# Patient Record
Sex: Male | Born: 1995 | Race: Black or African American | Hispanic: No | Marital: Single | Smoking: Current every day smoker
Health system: Southern US, Community
[De-identification: ages and names within clinical notes are randomized; demographics above are authoritative.]

## PROBLEM LIST (undated history)

## (undated) DIAGNOSIS — J45909 Unspecified asthma, uncomplicated: Secondary | ICD-10-CM

---
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
## 2013-11-02 ENCOUNTER — Encounter (HOSPITAL_BASED_OUTPATIENT_CLINIC_OR_DEPARTMENT_OTHER): Payer: Self-pay | Admitting: Emergency Medicine

## 2013-11-02 ENCOUNTER — Emergency Department (HOSPITAL_BASED_OUTPATIENT_CLINIC_OR_DEPARTMENT_OTHER)
Admission: EM | Admit: 2013-11-02 | Discharge: 2013-11-02 | Disposition: A | Discharge status: Home / Self Care | Payer: Medicaid Other | Attending: Emergency Medicine | Admitting: Emergency Medicine

## 2013-11-02 DIAGNOSIS — W503XXA Accidental bite by another person, initial encounter: Secondary | ICD-10-CM

## 2013-11-02 DIAGNOSIS — Z880 Allergy status to penicillin: Secondary | ICD-10-CM | POA: Insufficient documentation

## 2013-11-02 DIAGNOSIS — S21109A Unspecified open wound of unspecified front wall of thorax without penetration into thoracic cavity, initial encounter: Secondary | ICD-10-CM | POA: Insufficient documentation

## 2013-11-02 DIAGNOSIS — IMO0002 Reserved for concepts with insufficient information to code with codable children: Secondary | ICD-10-CM | POA: Insufficient documentation

## 2013-11-02 MED ORDER — CLINDAMYCIN HCL 300 MG PO CAPS: 300.0000 mg | ORAL_CAPSULE | Freq: Four times a day (QID) | ORAL | Status: DC
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
## 2014-06-08 ENCOUNTER — Emergency Department (HOSPITAL_BASED_OUTPATIENT_CLINIC_OR_DEPARTMENT_OTHER)
Admission: EM | Admit: 2014-06-08 | Discharge: 2014-06-08 | Disposition: A | Discharge status: Home / Self Care | Payer: Medicaid Other | Attending: Emergency Medicine | Admitting: Emergency Medicine

## 2014-06-08 ENCOUNTER — Encounter (HOSPITAL_BASED_OUTPATIENT_CLINIC_OR_DEPARTMENT_OTHER): Payer: Self-pay | Admitting: *Deleted

## 2014-06-08 DIAGNOSIS — Z792 Long term (current) use of antibiotics: Secondary | ICD-10-CM | POA: Insufficient documentation

## 2014-06-08 DIAGNOSIS — R42 Dizziness and giddiness: Secondary | ICD-10-CM

## 2014-06-08 DIAGNOSIS — Z720 Tobacco use: Secondary | ICD-10-CM | POA: Insufficient documentation

## 2014-06-08 DIAGNOSIS — Z880 Allergy status to penicillin: Secondary | ICD-10-CM | POA: Insufficient documentation

## 2014-06-08 NOTE — ED Notes (Signed)
Pt states he needs a note for work

## 2014-06-08 NOTE — ED Notes (Signed)
Pt reports 10 second episode of dizziness last night- second episode this morning around 0800- denies sx at present

## 2014-06-08 NOTE — ED Provider Notes (Signed)
CSN: [id number]     Arrival date & time 06/08/14  1122 History   First MD Initiated Contact with Patient 06/08/14 1230     Chief Complaint  Patient presents with  . Dizziness     (Consider location/radiation/quality/duration/timing/severity/associated sxs/prior Treatment) HPI Comments: Patient is a 19 yo M PMHx significant for tobacco abuse presenting to the ED for evaluation of lightheadedness. Patient states he had a 10 second episode of lightheadedness without syncope, CP, SOB, HA. Patient states his symptoms resolved laying down. He had another recurrence this morning around 800AM, none since then. No cardiac history. No early familial cardiac history.  Patient is a 19 y.o. male presenting with dizziness.  Dizziness   History reviewed. No pertinent past medical history. History reviewed. No pertinent past surgical history. No family history on file. History  Substance Use Topics  . Smoking status: Current Some Day Smoker    Types: Cigarettes  . Smokeless tobacco: Never Used  . Alcohol Use: No    Review of Systems  Neurological: Positive for light-headedness.  All other systems reviewed and are negative.     Allergies  Amoxicillin and Penicillins  Home Medications   Prior to Admission medications   Medication Sig Start Date End Date Taking? Authorizing Provider  clindamycin (CLEOCIN) 300 MG capsule Take 1 capsule (300 mg total) by mouth 4 (four) times daily. X 7 days 11/02/13   Geoffery Lyonsouglas Delo, MD   BP 137/70 mmHg  Pulse 51  Temp(Src) 97.8 F (36.6 C) (Oral)  Resp 20  Ht 5\' 7"  (1.702 m)  Wt 145 lb (65.772 kg)  BMI 22.71 kg/m2  SpO2 100% Physical Exam  Constitutional: He is oriented to person, place, and time. He appears well-developed and well-nourished. No distress.  HENT:  Head: Normocephalic and atraumatic.  Right Ear: External ear normal.  Left Ear: External ear normal.  Nose: Nose normal.  Mouth/Throat: Oropharynx is clear and moist. No oropharyngeal  exudate.  Eyes: Conjunctivae and EOM are normal. Pupils are equal, round, and reactive to light.  Neck: Normal range of motion. Neck supple.  Cardiovascular: Normal rate, regular rhythm, normal heart sounds and intact distal pulses.   Pulmonary/Chest: Effort normal and breath sounds normal. No respiratory distress.  Abdominal: Soft. There is no tenderness.  Neurological: He is alert and oriented to person, place, and time. He has normal strength. No cranial nerve deficit. Gait normal. GCS eye subscore is 4. GCS verbal subscore is 5. GCS motor subscore is 6.  Sensation grossly intact.  No pronator drift.  Bilateral heel-knee-shin intact.  Skin: Skin is warm and dry. He is not diaphoretic.  Nursing note and vitals reviewed.   ED Course  Procedures (including critical care time) Labs Review Labs Reviewed - No data to display  Imaging Review No results found.   EKG Interpretation None      MDM   Final diagnoses:  Intermittent lightheadedness    Filed Vitals:   06/08/14 1127  BP: 137/70  Pulse: 51  Temp: 97.8 F (36.6 C)  Resp: 20   Afebrile, NAD, non-toxic appearing, AAOx4.  No neurofocal deficits on examination. No history of personal cardiac medical problems or early familial cardiac problems. No concerning physical examination findings. No orthostasis. Symptom free in ED. Advised PCP f/u. Return precautions discussed. Patient is stable at time of discharge     Francee PiccoloJennifer Ladaija Dimino, PA-C 06/09/14 [phone]  Shon Batonourtney F Horton, MD 06/09/14 [phone]

## 2014-06-08 NOTE — Discharge Instructions (Signed)
Please follow up with your primary care physician in 1-2 days. If you do not have one please call the [facility] number listed above. Please read all discharge instructions and return precautions.  ° ° °Dizziness °Dizziness is a common problem. It is a feeling of unsteadiness or light-headedness. You may feel like you are about to faint. Dizziness can lead to injury if you stumble or fall. A person of any age group can suffer from dizziness, but dizziness is more common in older adults. °CAUSES  °Dizziness can be caused by many different things, including: °· Middle ear problems. °· Standing for too long. °· Infections. °· An allergic reaction. °· Aging. °· An emotional response to something, such as the sight of blood. °· Side effects of medicines. °· Tiredness. °· Problems with circulation or blood pressure. °· Excessive use of alcohol or medicines, or illegal drug use. °· Breathing too fast (hyperventilation). °· An irregular heart rhythm (arrhythmia). °· A low red blood cell count (anemia). °· Pregnancy. °· Vomiting, diarrhea, fever, or other illnesses that cause body fluid loss (dehydration). °· Diseases or conditions such as Parkinson's disease, high blood pressure (hypertension), diabetes, and thyroid problems. °· Exposure to extreme heat. °DIAGNOSIS  °Your health care provider will ask about your symptoms, perform a physical exam, and perform an electrocardiogram (ECG) to record the electrical activity of your heart. Your health care provider may also perform other heart or blood tests to determine the cause of your dizziness. These may include: °· Transthoracic echocardiogram (TTE). During echocardiography, sound waves are used to evaluate how blood flows through your heart. °· Transesophageal echocardiogram (TEE). °· Cardiac monitoring. This allows your health care provider to monitor your heart rate and rhythm in real time. °· Holter monitor. This is a portable device that records  your heartbeat and can help diagnose heart arrhythmias. It allows your health care provider to track your heart activity for several days if needed. °· Stress tests by exercise or by giving medicine that makes the heart beat faster. °TREATMENT  °Treatment of dizziness depends on the cause of your symptoms and can vary greatly. °HOME CARE INSTRUCTIONS  °· Drink enough fluids to keep your urine clear or pale yellow. This is especially important in very hot weather. In older adults, it is also important in cold weather. °· Take your medicine exactly as directed if your dizziness is caused by medicines. When taking blood pressure medicines, it is especially important to get up slowly. °¨ Rise slowly from chairs and steady yourself until you feel okay. °¨ In the morning, first sit up on the side of the bed. When you feel okay, stand slowly while holding onto something until you know your balance is fine. °· Move your legs often if you need to stand in one place for a long time. Tighten and relax your muscles in your legs while standing. °· Have someone stay with you for 1-2 days if dizziness continues to be a problem. Do this until you feel you are well enough to stay alone. Have the person call your health care provider if he or she notices changes in you that are concerning. °· Do not drive or use heavy machinery if you feel dizzy. °· Do not drink alcohol. °SEEK IMMEDIATE MEDICAL CARE IF:  °· Your dizziness or light-headedness gets worse. °· You feel nauseous or vomit. °· You have problems talking, walking, or using your arms, hands, or legs. °· You feel weak. °· You are   not thinking clearly or you have trouble forming sentences. It may take a friend or family member to notice this. °· You have chest pain, abdominal pain, shortness of breath, or sweating. °· Your vision changes. °· You notice any bleeding. °· You have side effects from medicine that seems to be getting worse rather than better. °MAKE SURE YOU:   °· Understand these instructions. °· Will watch your condition. °· Will get help right away if you are not doing well or get worse. °Document Released: 09/01/2000 Document Revised: 03/13/2013 Document Reviewed: 09/25/2010 °Exit[employer]® Patient Information ©2015 [employer]. This information is not intended to replace advice given to you by your health care provider. Make sure you discuss any questions you have with your health care provider. ° °

## 2017-01-10 ENCOUNTER — Emergency Department (HOSPITAL_COMMUNITY): Payer: No Typology Code available for payment source

## 2017-01-10 ENCOUNTER — Encounter (HOSPITAL_COMMUNITY): Payer: Self-pay

## 2017-01-10 ENCOUNTER — Emergency Department (HOSPITAL_COMMUNITY)
Admission: EM | Admit: 2017-01-10 | Discharge: 2017-01-10 | Disposition: A | Discharge status: Home / Self Care | Payer: No Typology Code available for payment source | Attending: Emergency Medicine | Admitting: Emergency Medicine

## 2017-01-10 DIAGNOSIS — R200 Anesthesia of skin: Secondary | ICD-10-CM | POA: Insufficient documentation

## 2017-01-10 DIAGNOSIS — M25551 Pain in right hip: Secondary | ICD-10-CM | POA: Insufficient documentation

## 2017-01-10 DIAGNOSIS — F17210 Nicotine dependence, cigarettes, uncomplicated: Secondary | ICD-10-CM | POA: Diagnosis not present

## 2017-01-10 DIAGNOSIS — Y92410 Unspecified street and highway as the place of occurrence of the external cause: Secondary | ICD-10-CM | POA: Diagnosis not present

## 2017-01-10 DIAGNOSIS — Y999 Unspecified external cause status: Secondary | ICD-10-CM | POA: Insufficient documentation

## 2017-01-10 HISTORY — DX: Unspecified asthma, uncomplicated: J45.909

## 2017-01-10 LAB — COMPREHENSIVE METABOLIC PANEL
ALBUMIN: 4.4 g/dL (ref 3.5–5.0)
ALK PHOS: 82 U/L (ref 38–126)
ALT: 17 U/L (ref 17–63)
ANION GAP: 10 (ref 5–15)
AST: 26 U/L (ref 15–41)
BILIRUBIN TOTAL: 2.3 mg/dL — AB (ref 0.3–1.2)
BUN: 15 mg/dL (ref 6–20)
CALCIUM: 9.6 mg/dL (ref 8.9–10.3)
CO2: 26 mmol/L (ref 22–32)
CREATININE: 1.12 mg/dL (ref 0.61–1.24)
Chloride: 103 mmol/L (ref 101–111)
GFR calc Af Amer: >60 mL/min (ref >60)
GFR calc non Af Amer: >60 mL/min (ref >60)
GLUCOSE: 75 mg/dL (ref 65–99)
Potassium: 3.7 mmol/L (ref 3.5–5.1)
SODIUM: 139 mmol/L (ref 135–145)
TOTAL PROTEIN: 7.2 g/dL (ref 6.5–8.1)

## 2017-01-10 LAB — SAMPLE TO BLOOD BANK

## 2017-01-10 LAB — CBC
HEMATOCRIT: 44.2 % (ref 39.0–52.0)
HEMOGLOBIN: 15.5 g/dL (ref 13.0–17.0)
MCH: 30.2 pg (ref 26.0–34.0)
MCHC: 35.1 g/dL (ref 30.0–36.0)
MCV: 86 fL (ref 78.0–100.0)
Platelets: 228 10*3/uL (ref 150–400)
RBC: 5.14 MIL/uL (ref 4.22–5.81)
RDW: 13 % (ref 11.5–15.5)
WBC: 5.5 10*3/uL (ref 4.0–10.5)

## 2017-01-10 LAB — I-STAT CG4 LACTIC ACID, ED: Lactic Acid, Venous: 2.9 mmol/L (ref 0.5–1.9)

## 2017-01-10 LAB — I-STAT CHEM 8, ED
BUN: 21 mg/dL — AB (ref 6–20)
CALCIUM ION: 1.13 mmol/L — AB (ref 1.15–1.40)
CHLORIDE: 101 mmol/L (ref 101–111)
Creatinine, Ser: 1.1 mg/dL (ref 0.61–1.24)
Glucose, Bld: 76 mg/dL (ref 65–99)
HCT: 47 % (ref 39.0–52.0)
Hemoglobin: 16 g/dL (ref 13.0–17.0)
POTASSIUM: 3.9 mmol/L (ref 3.5–5.1)
SODIUM: 141 mmol/L (ref 135–145)
TCO2: 28 mmol/L (ref 22–32)

## 2017-01-10 LAB — CDS SEROLOGY

## 2017-01-10 LAB — ETHANOL

## 2017-01-10 LAB — PROTIME-INR
INR: 1.04
PROTHROMBIN TIME: 13.5 s (ref 11.4–15.2)

## 2017-01-10 MED ORDER — ONDANSETRON HCL 4 MG/2ML IJ SOLN
4.0000 mg | Freq: Once | INTRAMUSCULAR | Status: AC
  Administered 2017-01-10: 4 mg via INTRAVENOUS
  Filled 2017-01-10: qty 2

## 2017-01-10 MED ORDER — HYDROMORPHONE HCL 1 MG/ML IJ SOLN
0.5000 mg | Freq: Once | INTRAMUSCULAR | Status: AC
  Administered 2017-01-10: 0.5 mg via INTRAVENOUS
  Filled 2017-01-10: qty 1

## 2017-01-10 MED ORDER — CYCLOBENZAPRINE HCL 10 MG PO TABS: 10.0000 mg | ORAL_TABLET | Freq: Three times a day (TID) | ORAL | 0 refills | Status: AC

## 2017-01-10 MED ORDER — SODIUM CHLORIDE 0.9 % IV SOLN
INTRAVENOUS | Status: DC
  Administered 2017-01-10: 125 mL/h via INTRAVENOUS

## 2017-01-10 MED ORDER — IOPAMIDOL (ISOVUE-300) INJECTION 61%
INTRAVENOUS | Status: AC
  Administered 2017-01-10: 100 mL
  Filled 2017-01-10: qty 100

## 2017-01-10 MED ORDER — SODIUM CHLORIDE 0.9 % IV BOLUS (SEPSIS)
1000.0000 mL | Freq: Once | INTRAVENOUS | Status: AC
  Administered 2017-01-10: 1000 mL via INTRAVENOUS

## 2017-01-10 NOTE — ED Notes (Signed)
Dr. Miquel DunnGarza bedside to evaluate pt at this time, backboard removed by MD.
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
## 2017-01-10 NOTE — ED Notes (Signed)
ED Provider at bedside. 
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
## 2017-01-10 NOTE — ED Notes (Signed)
Upon entry to pt room, pt found sitting straight up in bed from the help of his family. Pt laid flat by this RN and asked to stay in the flat position until cleared after imaging. Xray bedside at this time.

## 2017-01-10 NOTE — ED Notes (Signed)
Dr. Verdie MosherLiu made aware of right lower extremity numbness and lack of movement. She is made aware that no spinal imaging is currently ordered. She gives VO for C spine CT
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
## 2017-01-10 NOTE — ED Notes (Addendum)
ED Provider at bedside. Dr. Joni FearsLui at bedside
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
## 2018-10-20 ENCOUNTER — Emergency Department (HOSPITAL_BASED_OUTPATIENT_CLINIC_OR_DEPARTMENT_OTHER)
Admission: EM | Admit: 2018-10-20 | Discharge: 2018-10-20 | Disposition: A | Discharge status: Home / Self Care | Payer: Self-pay | Attending: Emergency Medicine | Admitting: Emergency Medicine

## 2018-10-20 ENCOUNTER — Encounter (HOSPITAL_BASED_OUTPATIENT_CLINIC_OR_DEPARTMENT_OTHER): Payer: Self-pay

## 2018-10-20 ENCOUNTER — Other Ambulatory Visit: Payer: Self-pay

## 2018-10-20 DIAGNOSIS — J020 Streptococcal pharyngitis: Secondary | ICD-10-CM | POA: Insufficient documentation

## 2018-10-20 DIAGNOSIS — F17210 Nicotine dependence, cigarettes, uncomplicated: Secondary | ICD-10-CM | POA: Insufficient documentation

## 2018-10-20 DIAGNOSIS — J45909 Unspecified asthma, uncomplicated: Secondary | ICD-10-CM | POA: Insufficient documentation

## 2018-10-20 LAB — GROUP A STREP BY PCR: Group A Strep by PCR: DETECTED — AB

## 2018-10-20 MED ORDER — IBUPROFEN 800 MG PO TABS
800.0000 mg | ORAL_TABLET | Freq: Once | ORAL | Status: AC
  Administered 2018-10-20: 800 mg via ORAL
  Filled 2018-10-20: qty 1

## 2018-10-20 MED ORDER — AZITHROMYCIN 500 MG PO TABS: 500.0000 mg | ORAL_TABLET | Freq: Every day | ORAL | 0 refills | Status: AC

## 2018-10-20 NOTE — Discharge Instructions (Addendum)
You were seen in the emergency department and diagnosed with strep throat.  -A prescription has been sent to your pharmacy for azithromycin.  This is an antibiotic.  Please take as prescribed.  You should gradually feel better over the next few days. Take Tylenol and Ibuprofen for fever and pain. Follow up with your primary care provider in the next 1 week if you are not feeling better, if you do not have a primary care provider one is provided in your discharge instructions. Return to the emergency department for any new or worsening symptoms including but not limited to inability to open your mouth, inability to move your neck, worsening pain, change in your voice, inability to swallow your own saliva, drooling, or any other concerns.

## 2018-10-20 NOTE — ED Notes (Addendum)
Patient refused for Covid test.

## 2018-10-20 NOTE — ED Triage Notes (Addendum)
Pt c/o sore throat, cough, fever, loss of taste-n/v x 2 days-NAD-steady gait

## 2018-10-20 NOTE — ED Provider Notes (Signed)
[facility] EMERGENCY DEPARTMENT Provider Note   CSN: [id number] Arrival date & time: 10/20/18  1241    History   Chief Complaint Chief Complaint  Patient presents with  . Sore Throat    HPI Evan Little is a 23 y.o. male with past medical history of asthma presents emergency department today with chief complaint of sore throat.  He states this is been going on x3 days.  Patient reports pain is worse when swallowing.  He states his throat feels raw.  He rates the pain 8 out of 10 in severity.  He has been taking Tylenol with minimal relief.  He thinks he has been febrile at home but has not checked his temperature.  He reports associated loss of taste.  He denies cough, congestion, chest pain, shortness of breath, nausea, vomiting, neck pain. He denies any sick contacts.  Does not think he has been around anyone positive for COVID-19.   Past Medical History:  Diagnosis Date  . Asthma     There are no active problems to display for this patient.   History reviewed. No pertinent surgical history.      Home Medications    Prior to Admission medications   Medication Sig Start Date End Date Taking? Authorizing Provider  azithromycin (ZITHROMAX) 500 MG tablet Take 1 tablet (500 mg total) by mouth daily for 5 days. Take one pill daily for 5 days 10/20/18 10/25/18  Xylah Early, Harley Hallmark, PA-C    Family History No family history on file.  Social History Social History   Tobacco Use  . Smoking status: Current Some Day Smoker    Types: Cigarettes  . Smokeless tobacco: Never Used  Substance Use Topics  . Alcohol use: Yes    Comment: weekly  . Drug use: Yes    Types: Marijuana     Allergies   Amoxicillin and Penicillins   Review of Systems Review of Systems  Constitutional: Positive for fever. Negative for chills and diaphoresis.  HENT: Positive for sore throat. Negative for congestion, ear pain, facial swelling, sinus pressure, sinus pain, trouble  swallowing and voice change.   Respiratory: Negative for cough.   Cardiovascular: Negative for chest pain.  Gastrointestinal: Negative for abdominal pain, nausea and vomiting.  Musculoskeletal: Negative for neck pain and neck stiffness.  Skin: Negative for rash and wound.     Physical Exam Updated Vital Signs BP 124/81 (BP Location: Left Arm)   Pulse 73   Temp 98.8 F (37.1 C) (Oral)   Resp 18   Ht 5\' 6"  (1.676 m)   Wt 59.9 kg   SpO2 98%   BMI 21.31 kg/m   Physical Exam Vitals signs and nursing note reviewed.  Constitutional:      Appearance: He is well-developed. He is not ill-appearing or toxic-appearing.  HENT:     Head: Normocephalic and atraumatic.     Comments: No sinus or temporal tenderness.    Nose: Nose normal.     Mouth/Throat:     Comments: Minor erythema to oropharynx, no edema, no exudate, no tonsillar swelling, voice normal, neck supple without lymphadenopathy. Eyes:     General: No scleral icterus.       Right eye: No discharge.        Left eye: No discharge.     Conjunctiva/sclera: Conjunctivae normal.  Neck:     Musculoskeletal: Normal range of motion. No muscular tenderness.     Vascular: No JVD.  Cardiovascular:     Rate and  Rhythm: Normal rate and regular rhythm.     Pulses: Normal pulses.     Heart sounds: Normal heart sounds.  Pulmonary:     Effort: Pulmonary effort is normal.     Breath sounds: Normal breath sounds.  Abdominal:     General: There is no distension.  Musculoskeletal: Normal range of motion.  Lymphadenopathy:     Cervical: No cervical adenopathy.  Skin:    General: Skin is warm and dry.  Neurological:     Mental Status: He is oriented to person, place, and time.     GCS: GCS eye subscore is 4. GCS verbal subscore is 5. GCS motor subscore is 6.     Comments: Fluent speech, no facial droop.  Psychiatric:        Behavior: Behavior normal.      ED Treatments / Results  Labs (all labs ordered are listed, but only  abnormal results are displayed) Labs Reviewed  GROUP A STREP BY PCR - Abnormal; Notable for the following components:      Result Value   Group A Strep by PCR DETECTED (*)    All other components within normal limits    EKG None  Radiology No results found.  Procedures Procedures (including critical care time)  Medications Ordered in ED Medications  ibuprofen (ADVIL) tablet 800 mg (800 mg Oral Given 10/20/18 1359)     Initial Impression / Assessment and Plan / ED Course  I have reviewed the triage vital signs and the nursing notes.  Pertinent labs & imaging results that were available during my care of the patient were reviewed by me and considered in my medical decision making (see chart for details).  23 yo male who presents with sore throat. Still able to tolerate PO/secretions but with worsening pain. Patient is afebrile, non-toxic appearing, sitting comfortably on examination table. Vital signs reviewed and stable. On exam there is minor erythema to posterior oropharynx, no exudate.. Presentation not concerning for PTA or Ludwig's angina, Uvulitis, epiglottitis, peritonsillar abscess, or retropharyngeal abscess.   Patient is positive for strep.  Has allergies to amoxicillin and penicillins.  Will discharge home with prescription for azithromycin. Pt does not appear dehydrated, but did discuss importance of water rehydration.  Encouraged at home supportive care measures. Patient successfully fluid challenged in the ED without difficulty swallowing.  Strict return precautions given. NAD. VSS. Recommended PCP follow up for re-evaluation.   Evan Little was evaluated in Emergency Department on 10/20/2018 for the symptoms described in the history of present illness. He was evaluated in the context of the global COVID-19 pandemic, which necessitated consideration that the patient might be at risk for infection with the SARS-CoV-2 virus that causes COVID-19. Institutional protocols and  algorithms that pertain to the evaluation of patients at risk for COVID-19 are in a state of rapid change based on information released by regulatory bodies including the CDC and federal and state organizations. These policies and algorithms were followed during the patient's care in the ED.  This note was prepared using Dragon voice recognition software and may include unintentional dictation errors due to the inherent limitations of voice recognition software.   Final Clinical Impressions(s) / ED Diagnoses   Final diagnoses:  Strep throat    ED Discharge Orders         Ordered    azithromycin (ZITHROMAX) 500 MG tablet  Daily     10/20/18 1508           Ashlen Kiger, HolidayKaitlyn E,  PA-C 10/20/18 1511    Arby BarrettePfeiffer, Marcy, MD 10/21/18 [phone]

## 2019-10-25 ENCOUNTER — Encounter (HOSPITAL_BASED_OUTPATIENT_CLINIC_OR_DEPARTMENT_OTHER): Payer: Self-pay

## 2019-10-25 ENCOUNTER — Other Ambulatory Visit: Payer: Self-pay

## 2019-10-25 ENCOUNTER — Emergency Department (HOSPITAL_BASED_OUTPATIENT_CLINIC_OR_DEPARTMENT_OTHER)
Admission: EM | Admit: 2019-10-25 | Discharge: 2019-10-25 | Discharge status: Against Medical Advice | Payer: Self-pay | Attending: Emergency Medicine | Admitting: Emergency Medicine

## 2019-10-25 DIAGNOSIS — J45909 Unspecified asthma, uncomplicated: Secondary | ICD-10-CM | POA: Insufficient documentation

## 2019-10-25 DIAGNOSIS — F17210 Nicotine dependence, cigarettes, uncomplicated: Secondary | ICD-10-CM | POA: Insufficient documentation

## 2019-10-25 DIAGNOSIS — J029 Acute pharyngitis, unspecified: Secondary | ICD-10-CM | POA: Insufficient documentation

## 2019-10-25 LAB — GROUP A STREP BY PCR: Group A Strep by PCR: NOT DETECTED

## 2019-10-25 MED ORDER — DEXAMETHASONE 6 MG PO TABS
6.0000 mg | ORAL_TABLET | Freq: Once | ORAL | Status: AC
  Administered 2019-10-25: 6 mg via ORAL
  Filled 2019-10-25: qty 1

## 2019-10-25 MED ORDER — LIDOCAINE VISCOUS HCL 2 % MT SOLN
15.0000 mL | Freq: Once | OROMUCOSAL | Status: DC
  Filled 2019-10-25: qty 15

## 2019-10-25 MED ORDER — ACETAMINOPHEN 500 MG PO TABS
1000.0000 mg | ORAL_TABLET | Freq: Once | ORAL | Status: AC
  Administered 2019-10-25: 1000 mg via ORAL
  Filled 2019-10-25: qty 2

## 2019-10-25 NOTE — ED Triage Notes (Signed)
Pt arrives with c/o sore throat since Sunday with some fatigue, denies any other symptoms or being around anyone with similar symptoms.

## 2019-10-25 NOTE — ED Notes (Signed)
Pt states he does not want to be tested for Covid

## 2019-10-25 NOTE — ED Provider Notes (Signed)
[facility] EMERGENCY DEPARTMENT Provider Note   CSN: [id number] Arrival date & time: 10/25/19  1002    History Chief Complaint  Patient presents with  . Sore Throat    Evan Little is a 24 y.o. male with past medical history significant for recurrent strep pharyngitis, asthma who presents for evaluation of sore throat.  Sore throat since Sunday, 4 days PTA.  Has been taking intermittently some ibuprofen without relief.  No unilateral throat swelling.  Has also had a nonproductive cough. States he did get the Covid vaccine however he is not sure which one.  States it was a "two dose."  No sick contacts.  No headache, lightheadedness, dizziness, neck pain, neck stiffness, fever, chills, abdominal pain, diarrhea, dysuria last dose of ibuprofen over 24 hours ago.  States symptoms seem similar to his last episode of strep.  No recent antibiotics.  He has not been seen for this issue previously.  Rates his pain a 7/10.  States he has had limited p.o. intake lower was able to eat "[employer] chili." Denies additional aggravating or alleviating factors.  History obtained from patient and past medical records. No interpretor was used.  HPI     Past Medical History:  Diagnosis Date  . Asthma     There are no problems to display for this patient.   History reviewed. No pertinent surgical history.     No family history on file.  Social History   Tobacco Use  . Smoking status: Current Some Day Smoker    Types: Cigarettes  . Smokeless tobacco: Never Used  Vaping Use  . Vaping Use: Never used  Substance Use Topics  . Alcohol use: Yes    Comment: weekly  . Drug use: Yes    Types: Marijuana    Home Medications Prior to Admission medications   Not on File    Allergies    Amoxicillin and Penicillins  Review of Systems   Review of Systems  Constitutional: Negative.   HENT: Positive for sore throat and trouble swallowing. Negative for congestion, dental problem,  drooling, ear discharge, ear pain, facial swelling, mouth sores, nosebleeds, postnasal drip, rhinorrhea, sinus pressure, sinus pain, sneezing and voice change.   Respiratory: Negative.   Cardiovascular: Negative.   Gastrointestinal: Negative.   Genitourinary: Negative.   Musculoskeletal: Negative.   Skin: Negative.   Neurological: Negative.   All other systems reviewed and are negative.   Physical Exam Updated Vital Signs BP 136/83 (BP Location: Left Arm)   Pulse 72   Temp 99.6 F (37.6 C) (Oral)   Resp 18   Ht 5\' 7"  (1.702 m)   Wt 63.5 kg   SpO2 100%   BMI 21.93 kg/m   Physical Exam Vitals and nursing note reviewed.  Constitutional:      General: He is not in acute distress.    Appearance: He is well-developed. He is not ill-appearing, toxic-appearing or diaphoretic.  HENT:     Head: Normocephalic and atraumatic.     Jaw: There is normal jaw occlusion.     Right Ear: Tympanic membrane and ear canal normal.     Left Ear: Tympanic membrane and ear canal normal.     Nose: Nose normal. No congestion or rhinorrhea.     Right Sinus: No maxillary sinus tenderness or frontal sinus tenderness.     Left Sinus: No maxillary sinus tenderness or frontal sinus tenderness.     Mouth/Throat:     Lips: Pink.  Mouth: Mucous membranes are moist. No injury, lacerations, oral lesions or angioedema.     Tongue: No lesions. Tongue does not deviate from midline.     Palate: No mass and lesions.     Pharynx: Uvula midline. Posterior oropharyngeal erythema present. No pharyngeal swelling, oropharyngeal exudate or uvula swelling.     Tonsils: No tonsillar exudate or tonsillar abscesses. 2+ on the right. 2+ on the left.     Comments: No drooling, dysphagia or trismus.  Tongue midline.  Posterior oropharynx erythematous.  Eyes:     Extraocular Movements: Extraocular movements intact.     Conjunctiva/sclera: Conjunctivae normal.     Pupils: Pupils are equal, round, and reactive to light.    Neck:     Trachea: Trachea and phonation normal.     Comments: No neck stiffness or neck rigidity.  No meningismus. Cardiovascular:     Rate and Rhythm: Normal rate and regular rhythm.     Pulses: Normal pulses.          Radial pulses are 2+ on the right side and 2+ on the left side.       Dorsalis pedis pulses are 2+ on the right side and 2+ on the left side.       Posterior tibial pulses are 2+ on the right side and 2+ on the left side.     Heart sounds: Normal heart sounds.  Pulmonary:     Effort: Pulmonary effort is normal. No respiratory distress.     Breath sounds: Normal breath sounds and air entry.  Abdominal:     General: Bowel sounds are normal. There is no distension.     Palpations: Abdomen is soft.     Tenderness: There is no abdominal tenderness. There is no right CVA tenderness, left CVA tenderness, guarding or rebound. Negative signs include Murphy's sign and McBurney's sign.     Hernia: No hernia is present.     Comments: Soft, nontender. No rebound or guarding  Musculoskeletal:        General: Normal range of motion.     Cervical back: Full passive range of motion without pain, normal range of motion and neck supple.     Comments: Moves all 4 extremities without difficulty.  Compartments soft.  No bony tenderness.  Skin:    General: Skin is warm and dry.     Capillary Refill: Capillary refill takes less than 2 seconds.     Comments: Brisk capillary refill.  No edema, erythema or warmth.  No fluctuance or induration.  Neurological:     General: No focal deficit present.     Mental Status: He is alert.     Cranial Nerves: Cranial nerves are intact.     Sensory: Sensation is intact.     Motor: Motor function is intact.     Coordination: Coordination is intact.     Gait: Gait is intact.     Deep Tendon Reflexes: Reflexes are normal and symmetric.     Comments: Ambulatory without difficulty    ED Results / Procedures / Treatments   Labs (all labs ordered are  listed, but only abnormal results are displayed) Labs Reviewed  GROUP A STREP BY PCR    EKG None  Radiology No results found.  Procedures Procedures (including critical care time)  Medications Ordered in ED Medications  lidocaine (XYLOCAINE) 2 % viscous mouth solution 15 mL (15 mLs Mouth/Throat Refused 10/25/19 1213)  acetaminophen (TYLENOL) tablet 1,000 mg (1,000 mg Oral Given 10/25/19  1208)  dexamethasone (DECADRON) tablet 6 mg (6 mg Oral Given 10/25/19 1208)   ED Course  I have reviewed the triage vital signs and the nursing notes.  Pertinent labs & imaging results that were available during my care of the patient were reviewed by me and considered in my medical decision making (see chart for details).  24 year old presents for evaluation of sore throat.  He is afebrile, nonseptic, not ill-appearing.  Moderate posterior oropharyngeal erythema without exudate.  Tonsils 1+ bilaterally.  No drooling, dysphagia or trismus.  No evidence of PTA or RPA.  Sublingual area soft.  No submandibular swelling.  Low suspicion for deep space abscess or Ludwig's angina.  No neck stiffness or neck rigidity.  No meningismus.  Low suspicion for meningitis.  He has no congestion, rhinorrhea.  Does have nonproductive cough.  He is vaccinated with Covid denies any recent contacts.  Plan on symptomatic treatment, strep, Covid test and reassess.  I went to reassess patient and he was not in room.  Nursing did not see patient leave.   Patient declined COVID testing.  Went to reassess patient 20 minutes after first attempt at reassessment.  He is still not present in his room.  Was not able to reassess or discuss his strep test.  Strep negative.   Patient eloped from the emergency department.     MDM Rules/Calculators/A&P                           Final Clinical Impression(s) / ED Diagnoses Final diagnoses:  Sore throat    Rx / DC Orders ED Discharge Orders    None       Shermar Friedland A,  PA-C 10/25/19 1345    Pricilla Loveless, MD 10/25/19 1346

## 2019-10-25 NOTE — ED Notes (Signed)
Pt not found in room or anywhere in department, did not await discharge paperwork.

## 2019-10-25 NOTE — ED Notes (Signed)
Patient states he does have strep throat but is not getting better.

## 2021-11-12 ENCOUNTER — Other Ambulatory Visit: Payer: Self-pay

## 2021-11-12 ENCOUNTER — Encounter (HOSPITAL_BASED_OUTPATIENT_CLINIC_OR_DEPARTMENT_OTHER): Payer: Self-pay | Admitting: Emergency Medicine

## 2021-11-12 ENCOUNTER — Emergency Department (HOSPITAL_BASED_OUTPATIENT_CLINIC_OR_DEPARTMENT_OTHER)
Admission: EM | Admit: 2021-11-12 | Discharge: 2021-11-12 | Disposition: A | Discharge status: Home / Self Care | Payer: Self-pay | Attending: Emergency Medicine | Admitting: Emergency Medicine

## 2021-11-12 DIAGNOSIS — Z28310 Unvaccinated for covid-19: Secondary | ICD-10-CM | POA: Insufficient documentation

## 2021-11-12 DIAGNOSIS — U071 COVID-19: Secondary | ICD-10-CM | POA: Insufficient documentation

## 2021-11-12 LAB — SARS CORONAVIRUS 2 BY RT PCR: SARS Coronavirus 2 by RT PCR: POSITIVE — AB

## 2021-11-12 LAB — GROUP A STREP BY PCR: Group A Strep by PCR: NOT DETECTED

## 2021-11-12 MED ORDER — IBUPROFEN 800 MG PO TABS
800.0000 mg | ORAL_TABLET | Freq: Once | ORAL | Status: AC
  Administered 2021-11-12: 800 mg via ORAL
  Filled 2021-11-12: qty 1

## 2021-11-12 NOTE — ED Triage Notes (Signed)
Sore throat, dry cough and fever since yesterday. Took dayquil around 3 pm.

## 2021-11-12 NOTE — Discharge Instructions (Addendum)
You are positive for COVID.  Continue to drink plenty of fluids.  You can use warm water gargles, use lozenges, take Tylenol ibuprofen for pain control as well as fever control.  Your exam otherwise was reassuring.  Please return for any worsening symptoms.  Otherwise symptoms take amount of 5 to 7 days to improve.

## 2021-11-12 NOTE — ED Provider Notes (Signed)
[facility] EMERGENCY DEPARTMENT Provider Note   CSN: [id number] Arrival date & time: 11/12/21  1849     History  Chief Complaint  Patient presents with   Sore Throat    Evan Little is a 26 y.o. male.  25 year old male presents today for evaluation of cough, sore throat, fever, myalgias since yesterday.  Denies any known sick contacts.  Denies chest pain, shortness of breath.  Has not taken anything prior to arrival.  Febrile on arrival at 101.4.  Patient has not been vaccinated against COVID.  Denies any other health problems.  Overall healthy.  The history is provided by the patient. No language interpreter was used.       Home Medications Prior to Admission medications   Not on File      Allergies    Amoxicillin and Penicillins    Review of Systems   Review of Systems  Constitutional:  Positive for chills and fever.  HENT:  Positive for sore throat. Negative for congestion, drooling and trouble swallowing.   Respiratory:  Positive for cough. Negative for shortness of breath.   Cardiovascular:  Negative for chest pain.  Gastrointestinal:  Negative for nausea and vomiting.  Neurological:  Negative for light-headedness.  All other systems reviewed and are negative.   Physical Exam Updated Vital Signs BP 122/81 (BP Location: Left Arm)   Pulse 96   Temp (!) 101.4 F (38.6 C) (Oral)   Resp 18   Ht 6' (1.829 m)   Wt 71.2 kg   SpO2 98%   BMI 21.29 kg/m  Physical Exam Vitals and nursing note reviewed.  Constitutional:      General: He is not in acute distress.    Appearance: Normal appearance. He is not ill-appearing.  HENT:     Head: Normocephalic and atraumatic.     Nose: Nose normal.     Mouth/Throat:     Pharynx: Oropharynx is clear. No pharyngeal swelling, oropharyngeal exudate or posterior oropharyngeal erythema.     Tonsils: No tonsillar exudate or tonsillar abscesses.  Eyes:     Conjunctiva/sclera: Conjunctivae normal.  Cardiovascular:      Rate and Rhythm: Normal rate and regular rhythm.     Pulses: Normal pulses.  Pulmonary:     Effort: Pulmonary effort is normal. No respiratory distress.     Breath sounds: No wheezing or rales.  Musculoskeletal:        General: No deformity.  Skin:    Findings: No rash.  Neurological:     Mental Status: He is alert.     ED Results / Procedures / Treatments   Labs (all labs ordered are listed, but only abnormal results are displayed) Labs Reviewed  SARS CORONAVIRUS 2 BY RT PCR - Abnormal; Notable for the following components:      Result Value   SARS Coronavirus 2 by RT PCR POSITIVE (*)    All other components within normal limits  GROUP A STREP BY PCR    EKG None  Radiology No results found.  Procedures Procedures    Medications Ordered in ED Medications  ibuprofen (ADVIL) tablet 800 mg (800 mg Oral Given 11/12/21 1905)    ED Course/ Medical Decision Making/ A&P                           Medical Decision Making Risk Prescription drug management.   26 year old male presents today for evaluation of URI symptoms going on for 1  day.  Febrile on arrival.  Giving 100 Motrin.  Temperature during my evaluation was improved to 100.7.  Patient tested positive for COVID.  Negative for strep.  Symptomatic management discussed.  He is without chest pain, shortness of breath.  Patient is stable for discharge.  Discharged in stable condition.  Return precautions discussed.  Patient voices understanding and is in agreement with plan.   Final Clinical Impression(s) / ED Diagnoses Final diagnoses:  COVID-19    Rx / DC Orders ED Discharge Orders     None         Marita Kansas, PA-C 11/12/21 1955    Charlynne Pander, MD 11/19/21 [phone]

## 2021-12-13 ENCOUNTER — Emergency Department (HOSPITAL_BASED_OUTPATIENT_CLINIC_OR_DEPARTMENT_OTHER)
Admission: EM | Admit: 2021-12-13 | Discharge: 2021-12-13 | Disposition: A | Discharge status: Home / Self Care | Payer: Self-pay | Attending: Emergency Medicine | Admitting: Emergency Medicine

## 2021-12-13 ENCOUNTER — Encounter (HOSPITAL_BASED_OUTPATIENT_CLINIC_OR_DEPARTMENT_OTHER): Payer: Self-pay | Admitting: Emergency Medicine

## 2021-12-13 ENCOUNTER — Other Ambulatory Visit: Payer: Self-pay

## 2021-12-13 DIAGNOSIS — R21 Rash and other nonspecific skin eruption: Secondary | ICD-10-CM

## 2021-12-13 DIAGNOSIS — Z202 Contact with and (suspected) exposure to infections with a predominantly sexual mode of transmission: Secondary | ICD-10-CM | POA: Insufficient documentation

## 2021-12-13 LAB — URINALYSIS, ROUTINE W REFLEX MICROSCOPIC
Bilirubin Urine: NEGATIVE
Glucose, UA: NEGATIVE mg/dL
Hgb urine dipstick: NEGATIVE
Ketones, ur: NEGATIVE mg/dL
Leukocytes,Ua: NEGATIVE
Nitrite: NEGATIVE
Protein, ur: NEGATIVE mg/dL
Specific Gravity, Urine: 1.02 (ref 1.005–1.030)
pH: 7.5 (ref 5.0–8.0)

## 2021-12-13 MED ORDER — AZITHROMYCIN 250 MG PO TABS
2000.0000 mg | ORAL_TABLET | Freq: Once | ORAL | Status: AC
  Administered 2021-12-13: 2000 mg via ORAL
  Filled 2021-12-13: qty 8

## 2021-12-13 MED ORDER — DOXYCYCLINE HYCLATE 100 MG PO TABS: 100.0000 mg | ORAL_TABLET | Freq: Two times a day (BID) | ORAL | 0 refills | Status: AC

## 2021-12-13 MED ORDER — GENTAMICIN SULFATE 40 MG/ML IJ SOLN
240.0000 mg | Freq: Once | INTRAMUSCULAR | Status: DC
  Filled 2021-12-13: qty 6

## 2021-12-13 NOTE — ED Notes (Signed)
RN informed pt the IM gentamicin injection could either go in his quad or glutea muscle due to large amount of liquid (6 mL total). Pt refused injection due to inability to inject in deltoid. PA made aware.

## 2021-12-13 NOTE — ED Provider Notes (Signed)
[facility] EMERGENCY DEPARTMENT Provider Note   CSN: [id number] Arrival date & time: 12/13/21  1446     History  Chief Complaint  Patient presents with   Exposure to STD    Evan Little is a 26 y.o. male with no significant past medical history presents to the ED due to rash to penis x2 days.  He notes the rash is dryness on shaft of penis. Patient states his partner recently tested positive for chlamydia.  Denies abdominal pain.  No penile discharge.  Denies testicular pain and edema.  No fever or chills.  Denies history of herpes.  Denies shaving in groin region.  No nausea, vomiting, or diarrhea.  History obtained from patient and past medical records. No interpreter used during encounter.       Home Medications Prior to Admission medications   Medication Sig Start Date End Date Taking? Authorizing Provider  doxycycline (VIBRA-TABS) 100 MG tablet Take 1 tablet (100 mg total) by mouth 2 (two) times daily. 12/13/21  Yes Mamta Rimmer, Druscilla Brownie, PA-C      Allergies    Amoxicillin and Penicillins    Review of Systems   Review of Systems  Constitutional:  Negative for chills and fever.  Gastrointestinal:  Negative for abdominal pain.  Genitourinary:  Negative for dysuria, penile discharge, penile pain, penile swelling, scrotal swelling and testicular pain.  Skin:  Positive for rash.    Physical Exam Updated Vital Signs BP (!) 152/92 (BP Location: Left Arm)   Pulse 86   Temp 98.6 F (37 C) (Oral)   Resp 17   Ht 5\' 6"  (1.676 m)   Wt 65.8 kg   SpO2 99%   BMI 23.40 kg/m  Physical Exam Vitals and nursing note reviewed.  Constitutional:      General: He is not in acute distress.    Appearance: He is not ill-appearing.  HENT:     Head: Normocephalic.  Eyes:     Pupils: Pupils are equal, round, and reactive to light.  Cardiovascular:     Rate and Rhythm: Normal rate and regular rhythm.     Pulses: Normal pulses.     Heart sounds: Normal heart sounds. No  murmur heard.    No friction rub. No gallop.  Pulmonary:     Effort: Pulmonary effort is normal.     Breath sounds: Normal breath sounds.  Abdominal:     General: Abdomen is flat. There is no distension.     Palpations: Abdomen is soft.     Tenderness: There is no abdominal tenderness. There is no guarding or rebound.  Genitourinary:    Comments: GU exam performed with chaperone in room.  Small abrasions to shaft of penis.  Swab for herpes.  No blisters.  No penile discharge.  No testicular tenderness or edema. Musculoskeletal:        General: Normal range of motion.     Cervical back: Neck supple.  Skin:    General: Skin is warm and dry.  Neurological:     General: No focal deficit present.     Mental Status: He is alert.  Psychiatric:        Mood and Affect: Mood normal.        Behavior: Behavior normal.     ED Results / Procedures / Treatments   Labs (all labs ordered are listed, but only abnormal results are displayed) Labs Reviewed  HSV CULTURE AND TYPING  URINALYSIS, ROUTINE W REFLEX MICROSCOPIC  GC/CHLAMYDIA PROBE AMP (  [place]) NOT AT [facility]    EKG None  Radiology No results found.  Procedures Procedures    Medications Ordered in ED Medications  gentamicin (GARAMYCIN) injection 240 mg (has no administration in time range)  azithromycin (ZITHROMAX) tablet 2,000 mg (has no administration in time range)    ED Course/ Medical Decision Making/ A&P                           Medical Decision Making Amount and/or Complexity of Data Reviewed Labs: ordered. Decision-making details documented in ED Course.   26 year old male presents the ED due to penile rash x2 days.  Patient states his partner recently tested positive for chlamydia.  No dysuria.  No fever or chills.  No joint pain.  Upon arrival, stable vitals.  Patient in no acute distress.  GU exam performed with chaperone in room which demonstrates small abrasions to shaft of penis.  Swab for herpes  however, no distinct lesions.  No penile discharge.  No testicular tenderness or edema.  UA unremarkable.  No evidence of infection.  Gonorrhea/chlamydia pending.  Will treat prophylactically for gonorrhea and chlamydia given positive exposure.  Patient allergic to PCN, he was given alternative antibiotics. Herpes culture pending. Will hold on treatment given lesions do not appear distinct for HSV infection.  Patient discharged with doxycycline.  No testicular tenderness to suggest torsion or epididymitis. Strict ED precautions discussed with patient. Patient states understanding and agrees to plan. Patient discharged home in no acute distress and stable vitals        Final Clinical Impression(s) / ED Diagnoses Final diagnoses:  STD exposure  Penile rash    Rx / DC Orders ED Discharge Orders          Ordered    doxycycline (VIBRA-TABS) 100 MG tablet  2 times daily        12/13/21 1538              Karie Kirks 12/13/21 1548    Charlesetta Shanks, MD 12/13/21 1707

## 2021-12-13 NOTE — Discharge Instructions (Addendum)
It was a pleasure taking care of you today.  As discussed, your gonorrhea, chlamydia, and herpes test are pending.  You were treated for gonorrhea and chlamydia here in the ER.  I am sending you home with antibiotics.  Take as prescribed and finish all antibiotics.  Your results should be available within the next few days.  Return to the ER for new or worsening symptoms.

## 2021-12-13 NOTE — ED Triage Notes (Signed)
Patient reports sexual partner tested positive for chlamydia but he isn't sure. Patient reports to rash to penis x 2 days.

## 2021-12-14 LAB — GC/CHLAMYDIA PROBE AMP (~~LOC~~) NOT AT ARMC
Chlamydia: NEGATIVE
Comment: NEGATIVE
Comment: NORMAL
Neisseria Gonorrhea: POSITIVE — AB

## 2021-12-16 LAB — HSV CULTURE AND TYPING

## 2022-01-12 ENCOUNTER — Other Ambulatory Visit: Payer: Self-pay

## 2022-01-12 ENCOUNTER — Encounter (HOSPITAL_BASED_OUTPATIENT_CLINIC_OR_DEPARTMENT_OTHER): Payer: Self-pay | Admitting: Pediatrics

## 2022-01-12 ENCOUNTER — Emergency Department (HOSPITAL_BASED_OUTPATIENT_CLINIC_OR_DEPARTMENT_OTHER)
Admission: EM | Admit: 2022-01-12 | Discharge: 2022-01-12 | Disposition: A | Discharge status: Home / Self Care | Payer: Self-pay | Attending: Emergency Medicine | Admitting: Emergency Medicine

## 2022-01-12 DIAGNOSIS — R519 Headache, unspecified: Secondary | ICD-10-CM | POA: Insufficient documentation

## 2022-01-12 NOTE — Discharge Instructions (Signed)
Please read and follow all provided instructions.  Your diagnoses today include:  1. Acute nonintractable headache, unspecified headache type     Tests performed today include: Vital signs. See below for your results today.   Medications:  Please use over-the-counter NSAID medications (ibuprofen, naproxen) or Tylenol (acetaminophen) as directed on the packaging for pain -- as long as you do not have any reasons avoid these medications. Reasons to avoid NSAID medications include: weak kidneys, a history of bleeding in your stomach or gut, or uncontrolled high blood pressure or previous heart attack. Reasons to avoid Tylenol include: liver problems or ongoing alcohol use. Never take more than 4000mg  or 8 Extra strength Tylenol in a 24 hour period.     Take any prescribed medications only as directed.  Additional information:  Follow any educational materials contained in this packet.  You are having a headache. No specific cause was found today for your headache. It may have been a migraine or other cause of headache. Stress, anxiety, fatigue, and depression are common triggers for headaches.   Your headache today does not appear to be life-threatening or require hospitalization, but often the exact cause of headaches is not determined in the emergency department. Therefore, follow-up with your doctor is very important to find out what may have caused your headache and whether or not you need any further diagnostic testing or treatment.   Sometimes headaches can appear benign (not harmful), but then more serious symptoms can develop which should prompt an immediate re-evaluation by your doctor or the emergency department.  BE VERY CAREFUL not to take multiple medicines containing Tylenol (also called acetaminophen). Doing so can lead to an overdose which can damage your liver and cause liver failure and possibly death.   Follow-up instructions: Please follow-up with your primary care provider  in the next 3 days for further evaluation of your symptoms if not improved.   Return instructions:  Please return to the Emergency Department if you experience worsening symptoms. Return if the medications do not resolve your headache, if it recurs, or if you have multiple episodes of vomiting or cannot keep down fluids. Return if you have a change from the usual headache. RETURN IMMEDIATELY IF you: Develop a sudden, severe headache Develop confusion or become poorly responsive or faint Develop a fever above 100.15F or problem breathing Have a change in speech, vision, swallowing, or understanding Develop new weakness, numbness, tingling, incoordination in your arms or legs Have a seizure Please return if you have any other emergent concerns.  Additional Information:  Your vital signs today were: BP (!) 140/85 (BP Location: Right Arm)   Pulse (!) 58   Temp 98.1 F (36.7 C) (Oral)   Resp 18   Ht 5\' 7"  (1.702 m)   Wt 68 kg   SpO2 100%   BMI 23.49 kg/m  If your blood pressure (BP) was elevated above 135/85 this visit, please have this repeated by your doctor within one month. --------------

## 2022-01-12 NOTE — ED Triage Notes (Signed)
C/O headache since yesterday;

## 2022-01-12 NOTE — ED Notes (Signed)
Discharge instructions reviewed with patient. Patient verbalizes understanding, no further questions at this time. Medications/prescriptions and follow up information provided. No acute distress noted at time of departure.  

## 2022-01-12 NOTE — ED Provider Notes (Signed)
[facility] EMERGENCY DEPARTMENT Provider Note   CSN: [id number] Arrival date & time: 01/12/22  1210     History  Chief Complaint  Patient presents with   Headache    Evan Little is a 26 y.o. male.  Patient presents to the emergency department for evaluation of headache.  States that he developed a headache while at work yesterday.  There was no injury or chemical exposure.  Headache was generalized.  It came on gradually.  States that his work wanted him to be evaluated so that he can go back.  Patient does not typically get headaches.  He did not have any associated fevers, vomiting, confusion.  He took an Advil and the headache improved.  No neck pain.  No nasal congestion, dental pain.  No weakness, numbness, tingling in the arms of the legs.       Home Medications Prior to Admission medications   Medication Sig Start Date End Date Taking? Authorizing Provider  doxycycline (VIBRA-TABS) 100 MG tablet Take 1 tablet (100 mg total) by mouth 2 (two) times daily. 12/13/21   Mannie Stabile, PA-C      Allergies    Amoxicillin and Penicillins    Review of Systems   Review of Systems  Physical Exam Updated Vital Signs BP (!) 140/85 (BP Location: Right Arm)   Pulse (!) 58   Temp 98.1 F (36.7 C) (Oral)   Resp 18   Ht 5\' 7"  (1.702 m)   Wt 68 kg   SpO2 100%   BMI 23.49 kg/m   Physical Exam Vitals and nursing note reviewed.  Constitutional:      Appearance: He is well-developed.  HENT:     Head: Normocephalic and atraumatic.     Right Ear: Tympanic membrane, ear canal and external ear normal.     Left Ear: Tympanic membrane, ear canal and external ear normal.     Nose: Nose normal.     Mouth/Throat:     Pharynx: Uvula midline.  Eyes:     General: Lids are normal.     Conjunctiva/sclera: Conjunctivae normal.     Pupils: Pupils are equal, round, and reactive to light.  Cardiovascular:     Rate and Rhythm: Normal rate and regular rhythm.   Pulmonary:     Effort: Pulmonary effort is normal.     Breath sounds: Normal breath sounds.  Abdominal:     Palpations: Abdomen is soft.     Tenderness: There is no abdominal tenderness.  Musculoskeletal:        General: Normal range of motion.     Cervical back: Normal range of motion and neck supple. No tenderness or bony tenderness.  Skin:    General: Skin is warm and dry.  Neurological:     Mental Status: He is alert and oriented to person, place, and time.     GCS: GCS eye subscore is 4. GCS verbal subscore is 5. GCS motor subscore is 6.     Cranial Nerves: No cranial nerve deficit.     Sensory: No sensory deficit.     Motor: No abnormal muscle tone.     Coordination: Coordination normal.     Gait: Gait normal.     ED Results / Procedures / Treatments   Labs (all labs ordered are listed, but only abnormal results are displayed) Labs Reviewed - No data to display  EKG None  Radiology No results found.  Procedures Procedures    Medications Ordered in ED  Medications - No data to display  ED Course/ Medical Decision Making/ A&P    Patient seen and examined. History obtained directly from patient.   Labs/EKG: None ordered  Imaging: None ordered  Medications/Fluids: None ordered  Most recent vital signs reviewed and are as follows: BP (!) 140/85 (BP Location: Right Arm)   Pulse (!) 58   Temp 98.1 F (36.7 C) (Oral)   Resp 18   Ht 5\' 7"  (1.702 m)   Wt 68 kg   SpO2 100%   BMI 23.49 kg/m   Initial impression: Headache without red flags, resolved.  Plan: Discharge to home.  Patient states that he needs a note so that he can to return to work.  Denies treatments or other evaluation.  Prescriptions written for: None  Other home care instructions discussed: Avoidance of triggers, continued use of OTC meds  ED return instructions discussed: Patient counseled to return if they have severe HA, fever, neck pain, weakness in their arms or legs, slurred  speech, trouble walking or talking, confusion, trouble with their balance, or if they have any other concerns. Patient verbalizes understanding and agrees with plan.   Follow-up instructions discussed: Patient encouraged to follow-up with their PCP as needed with recurrent symptoms.                            Medical Decision Making  In regards to the patient's headache, critical differentials were considered including subarachnoid hemorrhage, intracerebral hemorrhage, epidural/subdural hematoma, pituitary apoplexy, vertebral/carotid artery dissection, giant cell arteritis, central venous thrombosis, reversible cerebral vasoconstriction, acute angle closure glaucoma, idiopathic intracranial hypertension, bacterial meningitis, viral encephalitis, carbon monoxide poisoning, posterior reversible encephalopathy syndrome, pre-eclampsia.   Reg flag symptoms related to these causes were considered including systemic symptoms (fever, weight loss), neurologic symptoms (confusion, mental status change, vision change, associated seizure), acute or sudden "thunderclap" onset, patient age 41 or older with new or progressive headache, patient of any age with first headache or change in headache pattern, pregnant or postpartum status, history of HIV or other immunocompromise, history of cancer, headache occurring with exertion, associated neck or shoulder pain, associated traumatic injury, concurrent use of anticoagulation, family history of spontaneous SAH, and concurrent drug use.    Other benign, more common causes of headache were considered including migraine, tension-type headache, cluster headache, referred pain from other cause such as sinus infection, dental pain, trigeminal neuralgia.   On exam, patient has a reassuring neuro exam including baseline mental status, no significant neck pain or meningeal signs, no signs of severe infection or fever.   The patient's vital signs, pertinent lab work and imaging  were reviewed and interpreted as discussed in the ED course. Hospitalization was considered for further testing, treatments, or serial exams/observation. However as patient is well-appearing, has a stable exam over the course of their evaluation, and reassuring studies today, I do not feel that they warrant admission at this time. This plan was discussed with the patient who verbalizes agreement and comfort with this plan and seems reliable and able to return to the Emergency Department with worsening or changing symptoms.          Final Clinical Impression(s) / ED Diagnoses Final diagnoses:  Acute nonintractable headache, unspecified headache type    Rx / DC Orders ED Discharge Orders     None         Carlisle Cater, PA-C 01/12/22 1517    Ezequiel Essex, MD 01/12/22 1726

## 2023-10-19 ENCOUNTER — Encounter (HOSPITAL_BASED_OUTPATIENT_CLINIC_OR_DEPARTMENT_OTHER): Payer: Self-pay | Admitting: Radiology

## 2023-10-19 ENCOUNTER — Other Ambulatory Visit: Payer: Self-pay

## 2023-10-19 ENCOUNTER — Emergency Department (HOSPITAL_BASED_OUTPATIENT_CLINIC_OR_DEPARTMENT_OTHER)
Admission: EM | Admit: 2023-10-19 | Discharge: 2023-10-19 | Disposition: A | Discharge status: Home / Self Care | Payer: Self-pay | Attending: Emergency Medicine | Admitting: Emergency Medicine

## 2023-10-19 DIAGNOSIS — J020 Streptococcal pharyngitis: Secondary | ICD-10-CM | POA: Insufficient documentation

## 2023-10-19 LAB — GROUP A STREP BY PCR: Group A Strep by PCR: DETECTED — AB

## 2023-10-19 MED ORDER — KETOROLAC TROMETHAMINE 30 MG/ML IJ SOLN
60.0000 mg | Freq: Once | INTRAMUSCULAR | Status: DC
  Filled 2023-10-19: qty 2

## 2023-10-19 MED ORDER — DEXAMETHASONE SODIUM PHOSPHATE 10 MG/ML IJ SOLN
10.0000 mg | Freq: Once | INTRAMUSCULAR | Status: DC
  Filled 2023-10-19: qty 1

## 2023-10-19 MED ORDER — AZITHROMYCIN 200 MG/5ML PO SUSR: 500.0000 mg | Freq: Every day | ORAL | 0 refills | Status: AC

## 2023-10-19 NOTE — Discharge Instructions (Addendum)
 Warm salt water gargles.  Tylenol  and ibuprofen .  Finish antibiotics.  Return if any worsening or concerning symptoms.

## 2023-10-19 NOTE — ED Triage Notes (Signed)
 Pt states that last week his throat started hurting but it has gotten worse this week. Pt endorses having strep throat in the past and feels that's what this is.

## 2023-10-19 NOTE — ED Provider Notes (Signed)
 [place] EMERGENCY DEPARTMENT AT [facility] Provider Note   CSN: [id number] Arrival date & time: 10/19/23  2002     Patient presents with: Sore Throat   Evan Little is a 28 y.o. male.  He is presenting with sore throat for 1 week.  Difficulty swallowing, feels like his breathing stops because his throat is so big.  Hurts to drink although has been able to hold down fluids.  History of strep throat in the past.  Low-grade fevers.  No vomiting or diarrhea.  {Add pertinent medical, surgical, social history, OB history to YEP:67052} The history is provided by the patient.  Sore Throat This is a recurrent problem. The current episode started more than 2 days ago. The problem occurs constantly. The problem has been gradually worsening. Associated symptoms include shortness of breath. Pertinent negatives include no chest pain and no abdominal pain. The symptoms are aggravated by swallowing. Nothing relieves the symptoms. He has tried nothing for the symptoms. The treatment provided no relief.       Prior to Admission medications   Medication Sig Start Date End Date Taking? Authorizing Provider  doxycycline  (VIBRA -TABS) 100 MG tablet Take 1 tablet (100 mg total) by mouth 2 (two) times daily. 12/13/21   Aberman, Caroline C, PA-C    Allergies: Amoxicillin and Penicillins    Review of Systems  Constitutional:  Positive for fever.  HENT:  Positive for sore throat and trouble swallowing.   Respiratory:  Positive for shortness of breath.   Cardiovascular:  Negative for chest pain.  Gastrointestinal:  Negative for abdominal pain.    Updated Vital Signs BP (!) 134/100 (BP Location: Left Arm)   Pulse 78   Temp 99.7 F (37.6 C) (Oral)   Resp (!) 24   Ht 5' 7 (1.702 m)   Wt 68 kg   SpO2 98%   BMI 23.49 kg/m   Physical Exam Vitals and nursing note reviewed.  Constitutional:      Appearance: He is well-developed.  HENT:     Head: Normocephalic and atraumatic.      Mouth/Throat:     Pharynx: Pharyngeal swelling and posterior oropharyngeal erythema present. No uvula swelling.     Tonsils: No tonsillar exudate or tonsillar abscesses.  Eyes:     Conjunctiva/sclera: Conjunctivae normal.  Cardiovascular:     Rate and Rhythm: Normal rate and regular rhythm.     Heart sounds: No murmur heard. Pulmonary:     Effort: Pulmonary effort is normal. No respiratory distress.     Breath sounds: Normal breath sounds.  Abdominal:     Palpations: Abdomen is soft.     Tenderness: There is no abdominal tenderness.  Musculoskeletal:     Cervical back: Neck supple.  Lymphadenopathy:     Cervical: Cervical adenopathy present.  Skin:    General: Skin is warm and dry.  Neurological:     General: No focal deficit present.     Mental Status: He is alert.     GCS: GCS eye subscore is 4. GCS verbal subscore is 5. GCS motor subscore is 6.     (all labs ordered are listed, but only abnormal results are displayed) Labs Reviewed  GROUP A STREP BY PCR - Abnormal; Notable for the following components:      Result Value   Group A Strep by PCR DETECTED (*)    All other components within normal limits    EKG: None  Radiology: No results found.  {Document cardiac monitor,  telemetry assessment procedure when appropriate:32947} Procedures   Medications Ordered in the ED  ketorolac  (TORADOL ) 30 MG/ML injection 60 mg (has no administration in time range)  dexamethasone  (DECADRON ) injection 10 mg (has no administration in time range)      {Click here for ABCD2, HEART and other calculators REFRESH Note before signing:1}                              Medical Decision Making Risk Prescription drug management.   This patient complains of ***; this involves an extensive number of treatment Options and is a complaint that carries with it a high risk of complications and morbidity. The differential includes ***  I ordered, reviewed and interpreted labs, which included  *** I ordered medication *** and reviewed PMP when indicated. I ordered imaging studies which included *** and I independently    visualized and interpreted imaging which showed *** Additional history obtained from *** Previous records obtained and reviewed *** I consulted *** and discussed lab and imaging findings and discussed disposition.  Cardiac monitoring reviewed, *** Social determinants considered, *** Critical Interventions: ***  After the interventions stated above, I reevaluated the patient and found *** Admission and further testing considered, ***   {Document critical care time when appropriate  Document review of labs and clinical decision tools ie [id number], etc  Document your independent review of radiology images and any outside records  Document your discussion with family members, caretakers and with consultants  Document social determinants of health affecting pt's care  Document your decision making why or why not admission, treatments were needed:32947:::1}   Final diagnoses:  None    ED Discharge Orders     None
